# Patient Record
Sex: Male | Born: 1945 | Race: White | Hispanic: No | Marital: Married | Smoking: Current every day smoker
Health system: Southern US, Community
[De-identification: ages and names within clinical notes are randomized; demographics above are authoritative.]

## PROBLEM LIST (undated history)

## (undated) DIAGNOSIS — L409 Psoriasis, unspecified: Secondary | ICD-10-CM

## (undated) DIAGNOSIS — H409 Unspecified glaucoma: Secondary | ICD-10-CM

## (undated) HISTORY — PX: BACK SURGERY: SHX140

---

## 2014-08-09 DIAGNOSIS — H4011X3 Primary open-angle glaucoma, severe stage: Secondary | ICD-10-CM | POA: Diagnosis not present

## 2014-11-01 DIAGNOSIS — H019 Unspecified inflammation of eyelid: Secondary | ICD-10-CM | POA: Diagnosis not present

## 2015-02-01 DIAGNOSIS — H4011X3 Primary open-angle glaucoma, severe stage: Secondary | ICD-10-CM | POA: Diagnosis not present

## 2015-04-24 DIAGNOSIS — Z720 Tobacco use: Secondary | ICD-10-CM | POA: Diagnosis not present

## 2015-04-24 DIAGNOSIS — H409 Unspecified glaucoma: Secondary | ICD-10-CM | POA: Diagnosis not present

## 2015-04-24 DIAGNOSIS — C4490 Unspecified malignant neoplasm of skin, unspecified: Secondary | ICD-10-CM | POA: Diagnosis not present

## 2015-04-24 DIAGNOSIS — M1990 Unspecified osteoarthritis, unspecified site: Secondary | ICD-10-CM | POA: Diagnosis not present

## 2015-05-08 DIAGNOSIS — H401133 Primary open-angle glaucoma, bilateral, severe stage: Secondary | ICD-10-CM | POA: Diagnosis not present

## 2015-08-07 DIAGNOSIS — H401133 Primary open-angle glaucoma, bilateral, severe stage: Secondary | ICD-10-CM | POA: Diagnosis not present

## 2016-02-05 DIAGNOSIS — H524 Presbyopia: Secondary | ICD-10-CM | POA: Diagnosis not present

## 2016-05-06 DIAGNOSIS — H401133 Primary open-angle glaucoma, bilateral, severe stage: Secondary | ICD-10-CM | POA: Diagnosis not present

## 2016-09-29 DIAGNOSIS — H401133 Primary open-angle glaucoma, bilateral, severe stage: Secondary | ICD-10-CM | POA: Diagnosis not present

## 2017-02-04 DIAGNOSIS — H524 Presbyopia: Secondary | ICD-10-CM | POA: Diagnosis not present

## 2017-02-17 DIAGNOSIS — H26493 Other secondary cataract, bilateral: Secondary | ICD-10-CM | POA: Diagnosis not present

## 2017-02-17 DIAGNOSIS — H401133 Primary open-angle glaucoma, bilateral, severe stage: Secondary | ICD-10-CM | POA: Diagnosis not present

## 2017-02-17 DIAGNOSIS — H02839 Dermatochalasis of unspecified eye, unspecified eyelid: Secondary | ICD-10-CM | POA: Diagnosis not present

## 2017-02-17 DIAGNOSIS — Z961 Presence of intraocular lens: Secondary | ICD-10-CM | POA: Diagnosis not present

## 2017-02-17 DIAGNOSIS — H26491 Other secondary cataract, right eye: Secondary | ICD-10-CM | POA: Diagnosis not present

## 2017-03-03 DIAGNOSIS — H26492 Other secondary cataract, left eye: Secondary | ICD-10-CM | POA: Diagnosis not present

## 2017-06-10 DIAGNOSIS — H401133 Primary open-angle glaucoma, bilateral, severe stage: Secondary | ICD-10-CM | POA: Diagnosis not present

## 2017-10-13 DIAGNOSIS — H401133 Primary open-angle glaucoma, bilateral, severe stage: Secondary | ICD-10-CM | POA: Diagnosis not present

## 2018-02-11 DIAGNOSIS — H401133 Primary open-angle glaucoma, bilateral, severe stage: Secondary | ICD-10-CM | POA: Diagnosis not present

## 2018-06-10 DIAGNOSIS — H401133 Primary open-angle glaucoma, bilateral, severe stage: Secondary | ICD-10-CM | POA: Diagnosis not present

## 2018-07-21 DIAGNOSIS — M25519 Pain in unspecified shoulder: Secondary | ICD-10-CM | POA: Diagnosis not present

## 2018-10-06 DIAGNOSIS — H401133 Primary open-angle glaucoma, bilateral, severe stage: Secondary | ICD-10-CM | POA: Diagnosis not present

## 2019-02-15 DIAGNOSIS — H401133 Primary open-angle glaucoma, bilateral, severe stage: Secondary | ICD-10-CM | POA: Diagnosis not present

## 2019-04-19 ENCOUNTER — Other Ambulatory Visit: Payer: Self-pay

## 2019-04-19 DIAGNOSIS — Z20822 Contact with and (suspected) exposure to covid-19: Secondary | ICD-10-CM

## 2019-04-21 LAB — NOVEL CORONAVIRUS, NAA: SARS-CoV-2, NAA: DETECTED — AB

## 2019-05-30 DIAGNOSIS — H938X9 Other specified disorders of ear, unspecified ear: Secondary | ICD-10-CM | POA: Diagnosis not present

## 2019-05-30 DIAGNOSIS — K529 Noninfective gastroenteritis and colitis, unspecified: Secondary | ICD-10-CM | POA: Diagnosis not present

## 2019-05-30 DIAGNOSIS — R05 Cough: Secondary | ICD-10-CM | POA: Diagnosis not present

## 2019-06-04 ENCOUNTER — Ambulatory Visit (HOSPITAL_COMMUNITY)
Admission: EM | Admit: 2019-06-04 | Discharge: 2019-06-04 | Disposition: A | Discharge status: Home / Self Care | Payer: Medicare Other | Attending: Emergency Medicine | Admitting: Emergency Medicine

## 2019-06-04 ENCOUNTER — Telehealth (HOSPITAL_COMMUNITY): Payer: Self-pay | Admitting: *Deleted

## 2019-06-04 ENCOUNTER — Other Ambulatory Visit: Payer: Self-pay

## 2019-06-04 ENCOUNTER — Ambulatory Visit (INDEPENDENT_AMBULATORY_CARE_PROVIDER_SITE_OTHER): Payer: Medicare Other

## 2019-06-04 ENCOUNTER — Encounter (HOSPITAL_COMMUNITY): Payer: Self-pay | Admitting: *Deleted

## 2019-06-04 DIAGNOSIS — K5900 Constipation, unspecified: Secondary | ICD-10-CM | POA: Diagnosis not present

## 2019-06-04 DIAGNOSIS — R05 Cough: Secondary | ICD-10-CM | POA: Diagnosis not present

## 2019-06-04 DIAGNOSIS — R110 Nausea: Secondary | ICD-10-CM | POA: Diagnosis not present

## 2019-06-04 HISTORY — DX: Unspecified glaucoma: H40.9

## 2019-06-04 MED ORDER — DOCUSATE SODIUM 100 MG PO CAPS: 100.0000 mg | ORAL_CAPSULE | Freq: Two times a day (BID) | ORAL | 0 refills | Status: DC

## 2019-06-04 MED ORDER — POLYETHYLENE GLYCOL 3350 17 G PO PACK: 17.0000 g | PACK | Freq: Every day | ORAL | 0 refills | Status: AC

## 2019-06-04 MED ORDER — OMEPRAZOLE 20 MG PO CPDR: 20.0000 mg | DELAYED_RELEASE_CAPSULE | Freq: Every day | ORAL | 0 refills | Status: AC

## 2019-06-04 MED ORDER — DOCUSATE SODIUM 100 MG PO CAPS: 100.0000 mg | ORAL_CAPSULE | Freq: Two times a day (BID) | ORAL | 0 refills | Status: AC

## 2019-06-04 MED ORDER — POLYETHYLENE GLYCOL 3350 17 G PO PACK: 17.0000 g | PACK | Freq: Every day | ORAL | 0 refills | Status: DC

## 2019-06-04 MED ORDER — OMEPRAZOLE 20 MG PO CPDR: 20.0000 mg | DELAYED_RELEASE_CAPSULE | Freq: Every day | ORAL | 0 refills | Status: DC

## 2019-06-04 NOTE — ED Triage Notes (Addendum)
Pt was + Covid 04/19/19 without any sxs.  C/O onset 3 wks ago with cough.  Denies SOB, fever, chills, chest pain. C/O having diarrhea approx 3 wks ago - took Imodium.  States since then has not had a good BM (states very small results) x 3 wks despite taking prune juice and Milk of Mag without results.  Denies nausea, but states anytime he tries to eat solid foods, he vomits.  Denies any abd pain.  States had video visit with PCP office 06/01/19 - given Rx for ondansetron and promethazine w/ codeine syrup.

## 2019-06-04 NOTE — Discharge Instructions (Signed)
Begin omprazole daily for 2 weeks  Please use Miralax for moderate to severe constipation. Take this once a day for the next 2-3 days. Please also start docusate stool softener, twice a day for at least 1 week. If stools become loose, cut down to once a day for another week. If stools remain loose, cut back to 1 pill every other day for a third week. You can stop docusate thereafter and resume as needed for constipation.  To help reduce constipation and promote bowel health: 1. Drink at least 64 ounces of water each day 2. Eat plenty of fiber (fruits, vegetables, whole grains, legumes) 3. Be physically active or exercise including walking, jogging, swimming, yoga, etc. 4. For active constipation use a stool softener (docusate) or an osmotic laxative (like Miralax) each day, or as needed.

## 2019-06-05 NOTE — ED Provider Notes (Signed)
[facility]    CSN: [id number] Arrival date & time: 06/04/19  1040      History   Chief Complaint Chief Complaint  Patient presents with  . Cough  . Constipation  . Emesis    HPI Jose Glenn is a 74 y.o. male history of tobacco use, glaucoma, presenting today for evaluation of cough and constipation.  Patient states that he tested positive for Covid on 11/25.  At the time he did not have any symptoms.  After the past 3 weeks he does note that he has had a cough.  Denies associated sore throat, congestion, fevers chills or body aches.  He also notes that initially he had a small amount of loose stools and took Imodium.  Over the past 3 weeks since this he has had very small bowel movements as well as associated nausea with eating.  He notes that he is able to tolerate liquids and soft foods, but any solid foods make him very nauseous.  He denies associated abdominal pain.  He was given Zofran and promethazine with codeine which have not helped his symptoms.  Denies any blood in the stool.  Has history of prior hernia repair.  Reports greater than 50-pack-year history.  HPI  Past Medical History:  Diagnosis Date  . Glaucoma     There are no problems to display for this patient.   Past Surgical History:  Procedure Laterality Date  . BACK SURGERY         Home Medications    Prior to Admission medications   Medication Sig Start Date End Date Taking? Authorizing Provider  Brinzolamide (AZOPT OP) Apply to eye.   Yes [provider]  ondansetron (ZOFRAN) 8 MG tablet Take by mouth every 8 (eight) hours as needed for nausea or vomiting.   Yes [provider]  promethazine-codeine (PHENERGAN WITH CODEINE) 6.25-10 MG/5ML syrup Take by mouth every 6 (six) hours as needed for cough.   Yes [provider]  TIMOLOL-HYDROCHLOROTHIAZIDE PO Take by mouth.   Yes [provider]  UNKNOWN TO PATIENT Another glaucoma eye gtt   Yes [provider]  docusate sodium (COLACE) 100 MG capsule Take 1 capsule (100 mg total) by mouth 2 (two) times daily for 7 days. 06/04/19 06/11/19  Melynda Ripple, MD  omeprazole (PRILOSEC) 20 MG capsule Take 1 capsule (20 mg total) by mouth daily for 14 days. 06/04/19 06/18/19  Melynda Ripple, MD  polyethylene glycol (MIRALAX / GLYCOLAX) 17 g packet Take 17 g by mouth daily for 7 days. 06/04/19 06/11/19  Melynda Ripple, MD    Family History Family History  Family history unknown: Yes    Social History Social History   Tobacco Use  . Smoking status: Current Every Day Smoker  . Smokeless tobacco: Never Used  Substance Use Topics  . Alcohol use: Not Currently  . Drug use: Never     Allergies   Patient has no known allergies.   Review of Systems Review of Systems  Constitutional: Negative for activity change, appetite change, chills, fatigue and fever.  HENT: Negative for congestion, ear pain, rhinorrhea, sinus pressure, sore throat and trouble swallowing.   Eyes: Negative for discharge and redness.  Respiratory: Positive for cough. Negative for chest tightness and shortness of breath.   Cardiovascular: Negative for chest pain.  Gastrointestinal: Positive for constipation. Negative for abdominal pain, diarrhea, nausea and vomiting.  Musculoskeletal: Negative for myalgias.  Skin: Negative for rash.  Neurological: Negative for dizziness,  light-headedness and headaches.     Physical Exam Triage Vital Signs ED Triage Vitals  Enc Vitals Group     BP 06/04/19 1152 130/86     Pulse Rate 06/04/19 1152 87     Resp 06/04/19 1152 18     Temp 06/04/19 1152 (!) 97.2 F (36.2 C)     Temp Source 06/04/19 1152 Oral     SpO2 06/04/19 1152 96 %     Weight --      Height --      Head Circumference --      Peak Flow --      Pain Score 06/04/19 1153 0     Pain Loc --      Pain Edu? --      Excl. in [place]? --    No data found.  Updated Vital Signs BP 130/86   Pulse 87   Temp (!)  97.2 F (36.2 C) (Oral)   Resp 18   SpO2 96%   Visual Acuity Right Eye Distance:   Left Eye Distance:   Bilateral Distance:    Right Eye Near:   Left Eye Near:    Bilateral Near:     Physical Exam Vitals and nursing note reviewed.  Constitutional:      Appearance: He is well-developed.     Comments: Sitting comfortably on exam table, no acute distress  HENT:     Head: Normocephalic and atraumatic.     Ears:     Comments: Bilateral ears without tenderness to palpation of external auricle, tragus and mastoid, EAC's without erythema or swelling, TM's with good bony landmarks and cone of light. Non erythematous.    Mouth/Throat:     Comments: Oral mucosa pink and moist, no tonsillar enlargement or exudate. Posterior pharynx patent and nonerythematous, no uvula deviation or swelling. Normal phonation. Eyes:     Conjunctiva/sclera: Conjunctivae normal.  Cardiovascular:     Rate and Rhythm: Normal rate and regular rhythm.     Heart sounds: No murmur.  Pulmonary:     Effort: Pulmonary effort is normal. No respiratory distress.     Breath sounds: Normal breath sounds.     Comments: Breathing comfortably at rest, CTABL, no wheezing, rales or other adventitious sounds auscultated Abdominal:     Palpations: Abdomen is soft.     Tenderness: There is no abdominal tenderness.     Comments: Soft, nondistended, nontender to light and deep palpation throughout entire abdomen  Musculoskeletal:     Cervical back: Neck supple.  Skin:    General: Skin is warm and dry.  Neurological:     Mental Status: He is alert.      UC Treatments / Results  Labs (all labs ordered are listed, but only abnormal results are displayed) Labs Reviewed - No data to display  EKG   Radiology DG Abd Acute W/Chest  Result Date: 06/04/2019 CLINICAL DATA:  Cough and constipation, smoker EXAM: DG ABDOMEN ACUTE W/ 1V CHEST COMPARISON:  None. FINDINGS: Normal heart size and vascularity. Lungs remain clear. No  focal pneumonia, collapse or consolidation. Negative for edema, effusion pneumothorax. Trachea midline. Aorta atherosclerotic. No free air. Scattered air and stool throughout the bowel. Negative for obstruction or significant dilatation. No ileus. Degenerative changes of the spine. No acute osseous finding. No calcifications. IMPRESSION: No acute finding by plain radiography. Electronically Signed   By: Jerilynn Mages.  Shick M.D.   On: 06/04/2019 12:50    Procedures Procedures (including critical care time)  Medications  Ordered in UC Medications - No data to display  Initial Impression / Assessment and Plan / UC Course  I have reviewed the triage vital signs and the nursing notes.  Pertinent labs & imaging results that were available during my care of the patient were reviewed by me and considered in my medical decision making (see chart for details).     Acute abdomen was just obtained given length of cough as well as persistence of constipation with associated nausea.  No sign of obstruction from her pneumonia.  Does show scattered stool throughout colon.  Will treat for constipation with MiraLAX and Colace.  Also discussed lifestyle modifications.  Given length of nausea, do not suspect this is related to infectious etiology.  Possible underlying GERD/gastritis.  Will initiate on omeprazole daily for 2-week trial.  If symptoms persist consider gastroenterology referral.  Patient has never had a colonoscopy.  Discussed strict return precautions. Patient verbalized understanding and is agreeable with plan.  Final Clinical Impressions(s) / UC Diagnoses   Final diagnoses:  Constipation, unspecified constipation type  Nausea without vomiting     Discharge Instructions     Begin omprazole daily for 2 weeks  Please use Miralax for moderate to severe constipation. Take this once a day for the next 2-3 days. Please also start docusate stool softener, twice a day for at least 1 week. If stools become  loose, cut down to once a day for another week. If stools remain loose, cut back to 1 pill every other day for a third week. You can stop docusate thereafter and resume as needed for constipation.  To help reduce constipation and promote bowel health: 1. Drink at least 64 ounces of water each day 2. Eat plenty of fiber (fruits, vegetables, whole grains, legumes) 3. Be physically active or exercise including walking, jogging, swimming, yoga, etc. 4. For active constipation use a stool softener (docusate) or an osmotic laxative (like Miralax) each day, or as needed.   ED Prescriptions    Medication Sig Dispense Auth. Provider   omeprazole (PRILOSEC) 20 MG capsule Take 1 capsule (20 mg total) by mouth daily for 14 days. 14 capsule Amit Meloy C, PA-C   polyethylene glycol (MIRALAX / GLYCOLAX) 17 g packet Take 17 g by mouth daily for 7 days. 14 each Leniyah Martell C, PA-C   docusate sodium (COLACE) 100 MG capsule Take 1 capsule (100 mg total) by mouth 2 (two) times daily for 7 days. 14 capsule Cledith Kamiya, [place] C, PA-C     PDMP not reviewed this encounter.   Joneen Caraway Mood[place] C, [place] 06/05/19 [phone]

## 2019-07-07 DIAGNOSIS — H401133 Primary open-angle glaucoma, bilateral, severe stage: Secondary | ICD-10-CM | POA: Diagnosis not present

## 2019-07-18 DIAGNOSIS — R05 Cough: Secondary | ICD-10-CM | POA: Diagnosis not present

## 2019-07-18 DIAGNOSIS — H6120 Impacted cerumen, unspecified ear: Secondary | ICD-10-CM | POA: Diagnosis not present

## 2019-07-18 DIAGNOSIS — D489 Neoplasm of uncertain behavior, unspecified: Secondary | ICD-10-CM | POA: Diagnosis not present

## 2019-10-17 ENCOUNTER — Other Ambulatory Visit: Payer: Self-pay

## 2019-10-17 ENCOUNTER — Ambulatory Visit (HOSPITAL_COMMUNITY)
Admission: EM | Admit: 2019-10-17 | Discharge: 2019-10-17 | Disposition: A | Discharge status: Home / Self Care | Payer: Medicare Other

## 2019-10-17 ENCOUNTER — Encounter (HOSPITAL_COMMUNITY): Payer: Self-pay | Admitting: Emergency Medicine

## 2019-10-17 DIAGNOSIS — K5900 Constipation, unspecified: Secondary | ICD-10-CM | POA: Diagnosis not present

## 2019-10-17 HISTORY — DX: Psoriasis, unspecified: L40.9

## 2019-10-17 NOTE — Discharge Instructions (Addendum)
Increase the MiraLAX to daily until you get a good bowel movement. Make sure you increase your fiber and eat small meals. Increase your water intake and stay hydrated If your symptoms worsen to include worsening pain or vomiting you need to go to the ER.

## 2019-10-17 NOTE — ED Provider Notes (Signed)
[facility]    CSN: [id number] Arrival date & time: 10/17/19  0803      History   Chief Complaint Chief Complaint  Patient presents with  . Abdominal Pain    HPI Jose Glenn is a 74 y.o. male.   Pt is a 74 year old male with a history of constipation, hernia repair, gastritis, and back surgery. Pt reports inability to tolerate solid food since Friday. Reports adequate intake of PO liquids. Pt complains of gagging/coughing after attempting to eat solid foods related to a feeling of fullness. Complains of lower abdominal pain, states likely related to sore muscles from coughing. Pain relieved by laying on side, worsened by coughing. Unsure of last bowel movement, states several small stools. No relief of symptoms after taking Miralax 3 days ago. States symptoms similar to constipation in the past. Reports low intake of fiber. Denies chest pain, nausea, vomiting, back pain, urinary frequency/urgency, dysuria, or being around anyone with similar symptoms or illness.   ROS per HPI      Past Medical History:  Diagnosis Date  . Glaucoma   . Psoriasis     There are no problems to display for this patient.   Past Surgical History:  Procedure Laterality Date  . BACK SURGERY         Home Medications    Prior to Admission medications   Medication Sig Start Date End Date Taking? Authorizing Provider  Brinzolamide (AZOPT OP) Apply to eye.   Yes [provider]  ondansetron (ZOFRAN) 8 MG tablet Take by mouth every 8 (eight) hours as needed for nausea or vomiting.   Yes [provider]  promethazine-codeine (PHENERGAN WITH CODEINE) 6.25-10 MG/5ML syrup Take by mouth every 6 (six) hours as needed for cough.   Yes [provider]  TIMOLOL-HYDROCHLOROTHIAZIDE PO Take by mouth.   Yes [provider]  UNKNOWN TO PATIENT Another glaucoma eye gtt   Yes [provider]  omeprazole (PRILOSEC) 20 MG capsule Take 1 capsule (20 mg  total) by mouth daily for 14 days. 06/04/19 06/18/19  Melynda Ripple, MD    Family History Family History  Family history unknown: Yes    Social History Social History   Tobacco Use  . Smoking status: Current Every Day Smoker    Packs/day: 1.00  . Smokeless tobacco: Never Used  Substance Use Topics  . Alcohol use: Not Currently  . Drug use: Never     Allergies   Patient has no known allergies.   Review of Systems Review of Systems   Physical Exam Triage Vital Signs ED Triage Vitals  Enc Vitals Group     BP 10/17/19 0830 129/76     Pulse Rate 10/17/19 0830 89     Resp 10/17/19 0830 18     Temp 10/17/19 0830 97.8 F (36.6 C)     Temp Source 10/17/19 0830 Oral     SpO2 10/17/19 0830 100 %     Weight --      Height --      Head Circumference --      Peak Flow --      Pain Score 10/17/19 0833 9     Pain Loc --      Pain Edu? --      Excl. in [place]? --    No data found.  Updated Vital Signs BP 129/76 (BP Location: Right Arm)   Pulse 89   Temp 97.8 F (36.6 C) (Oral)   Resp 18  SpO2 100%   Visual Acuity Right Eye Distance:   Left Eye Distance:   Bilateral Distance:    Right Eye Near:   Left Eye Near:    Bilateral Near:     Physical Exam Vitals and nursing note reviewed.  Constitutional:      General: He is not in acute distress.    Appearance: Normal appearance. He is not ill-appearing, toxic-appearing or diaphoretic.  HENT:     Head: Normocephalic and atraumatic.     Nose: Nose normal.  Eyes:     Conjunctiva/sclera: Conjunctivae normal.  Cardiovascular:     Rate and Rhythm: Normal rate and regular rhythm.  Pulmonary:     Effort: Pulmonary effort is normal.     Breath sounds: Normal breath sounds.  Abdominal:     General: Bowel sounds are normal.     Palpations: Abdomen is soft.     Tenderness: There is generalized abdominal tenderness and tenderness in the left lower quadrant. There is no right CVA tenderness, left CVA tenderness, guarding  or rebound.  Musculoskeletal:        General: Normal range of motion.     Cervical back: Normal range of motion.  Skin:    General: Skin is warm and dry.  Neurological:     Mental Status: He is alert.  Psychiatric:        Mood and Affect: Mood normal.      UC Treatments / Results  Labs (all labs ordered are listed, but only abnormal results are displayed) Labs Reviewed - No data to display  EKG   Radiology No results found.  Procedures Procedures (including critical care time)  Medications Ordered in UC Medications - No data to display  Initial Impression / Assessment and Plan / UC Course  I have reviewed the triage vital signs and the nursing notes.  Pertinent labs & imaging results that were available during my care of the patient were reviewed by me and considered in my medical decision making (see chart for details).      Constipation Patient with chronic constipation. Symptoms and exam most consistent with this today. Recommended abdominal x-ray to rule out bowel obstruction but patient refused.  Not terribly concerned for this at this time.  Recommended if symptoms continue or worsen he will need to go to the ER. Recommended increase MiraLAX to daily until good bowel movement. Also recommend increase water and fiber Follow up as needed for continued or worsening symptoms  Final Clinical Impressions(s) / UC Diagnoses   Final diagnoses:  Constipation, unspecified constipation type     Discharge Instructions     Increase the MiraLAX to daily until you get a good bowel movement. Make sure you increase your fiber and eat small meals. Increase your water intake and stay hydrated If your symptoms worsen to include worsening pain or vomiting you need to go to the ER.     ED Prescriptions    None     PDMP not reviewed this encounter.   Orvan July, NP 10/17/19 [phone]

## 2019-10-17 NOTE — ED Triage Notes (Signed)
Pt c/o abd pain onset Friday, has had diarrhea with each meal. Has not been able to tolerate solid foods since Friday. Denies fever or blood in stool. Has been taking Maalox with no relief.

## 2019-10-17 NOTE — ED Notes (Signed)
Per Ruben Im, CMA, pt declined COVID testing on intake eval.

## 2019-10-21 DIAGNOSIS — R109 Unspecified abdominal pain: Secondary | ICD-10-CM | POA: Diagnosis not present

## 2019-10-27 DIAGNOSIS — H401133 Primary open-angle glaucoma, bilateral, severe stage: Secondary | ICD-10-CM | POA: Diagnosis not present

## 2020-01-11 ENCOUNTER — Encounter: Payer: Self-pay | Admitting: Internal Medicine

## 2020-01-11 LAB — IFOBT (OCCULT BLOOD): IFOBT: NEGATIVE

## 2020-02-16 DIAGNOSIS — H401133 Primary open-angle glaucoma, bilateral, severe stage: Secondary | ICD-10-CM | POA: Diagnosis not present

## 2020-02-17 DIAGNOSIS — H353134 Nonexudative age-related macular degeneration, bilateral, advanced atrophic with subfoveal involvement: Secondary | ICD-10-CM | POA: Diagnosis not present

## 2020-04-13 DIAGNOSIS — H02142 Spastic ectropion of right lower eyelid: Secondary | ICD-10-CM | POA: Diagnosis not present

## 2020-04-13 DIAGNOSIS — H16213 Exposure keratoconjunctivitis, bilateral: Secondary | ICD-10-CM | POA: Diagnosis not present

## 2020-04-13 DIAGNOSIS — H02145 Spastic ectropion of left lower eyelid: Secondary | ICD-10-CM | POA: Diagnosis not present

## 2020-06-19 DIAGNOSIS — H401133 Primary open-angle glaucoma, bilateral, severe stage: Secondary | ICD-10-CM | POA: Diagnosis not present

## 2020-07-08 IMAGING — DX DG ABDOMEN ACUTE W/ 1V CHEST
4 series · 4 of 4 positions shown · non-contrast
Comparison: None.

CLINICAL DATA: Cough and constipation, smoker

EXAM:
DG ABDOMEN ACUTE W/ 1V CHEST

[chest pa]
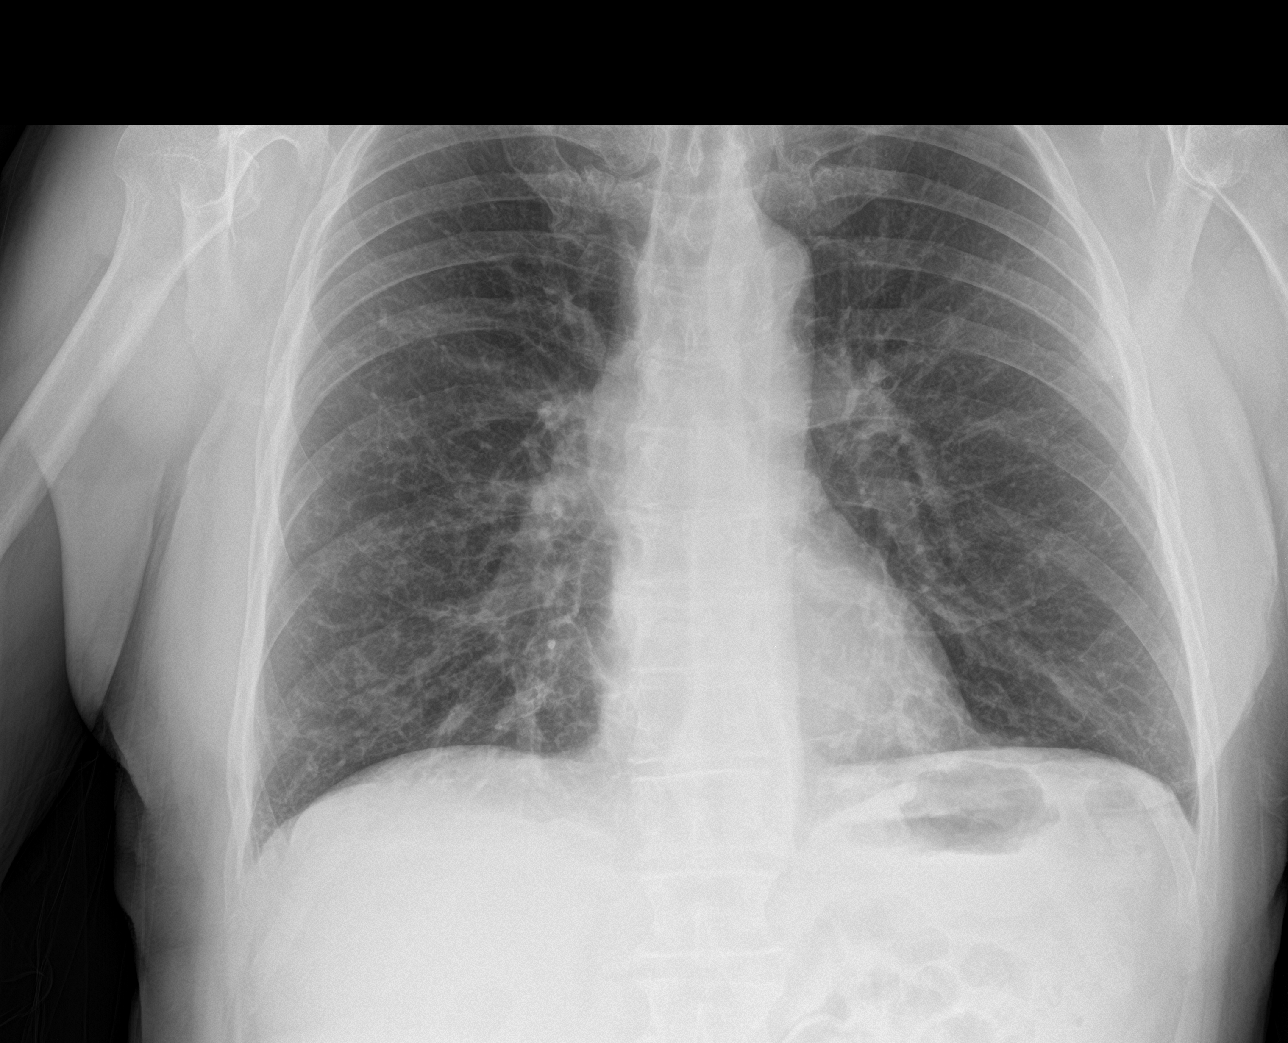

[abdomen erect]
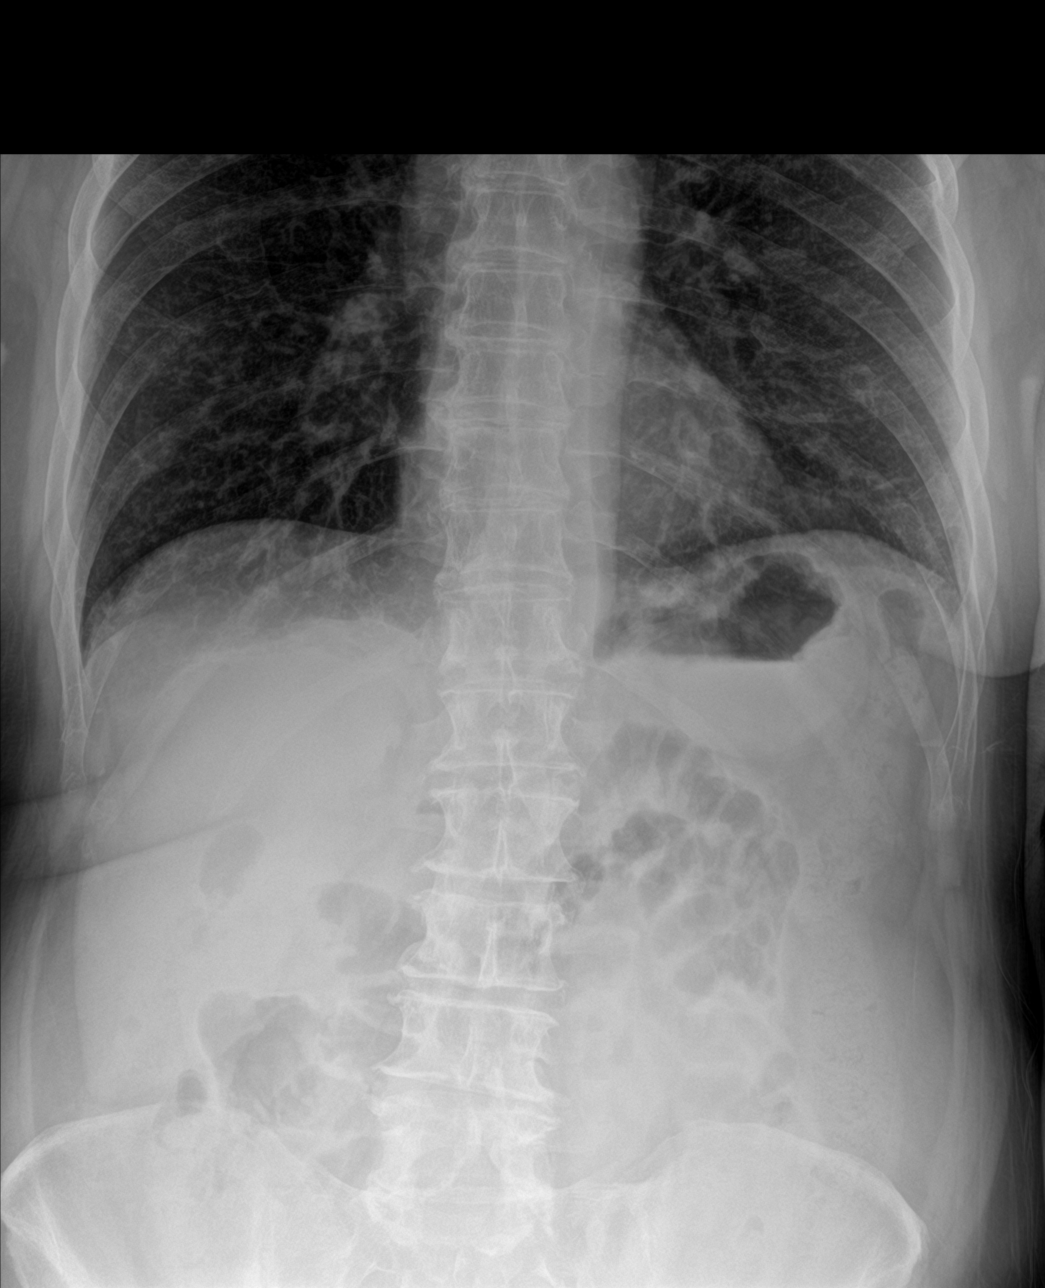

[abdomen supine (1 of 2)]
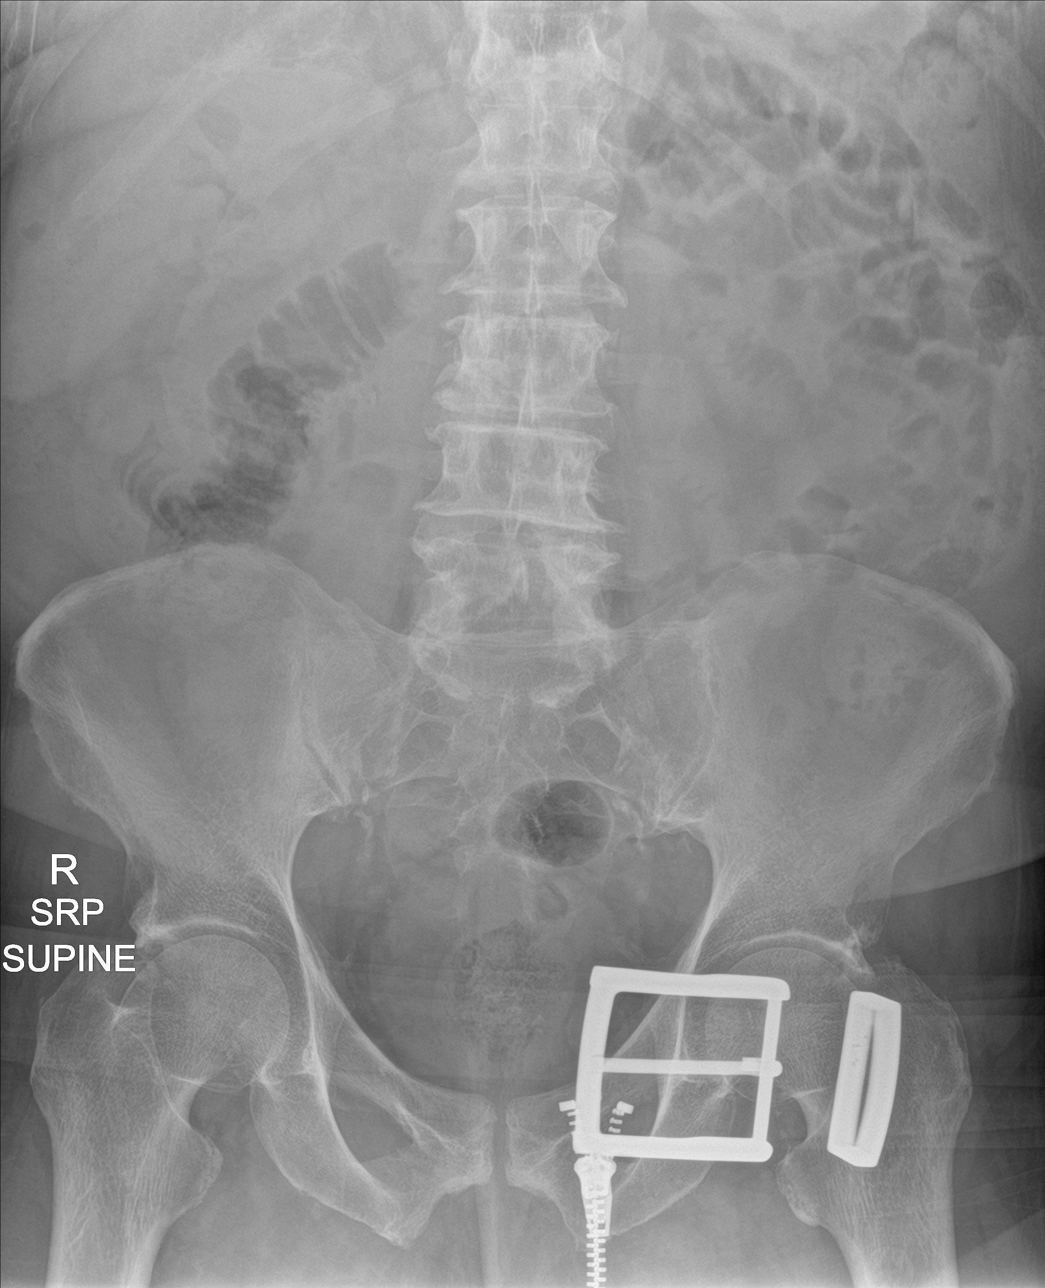

[abdomen supine (2 of 2)]
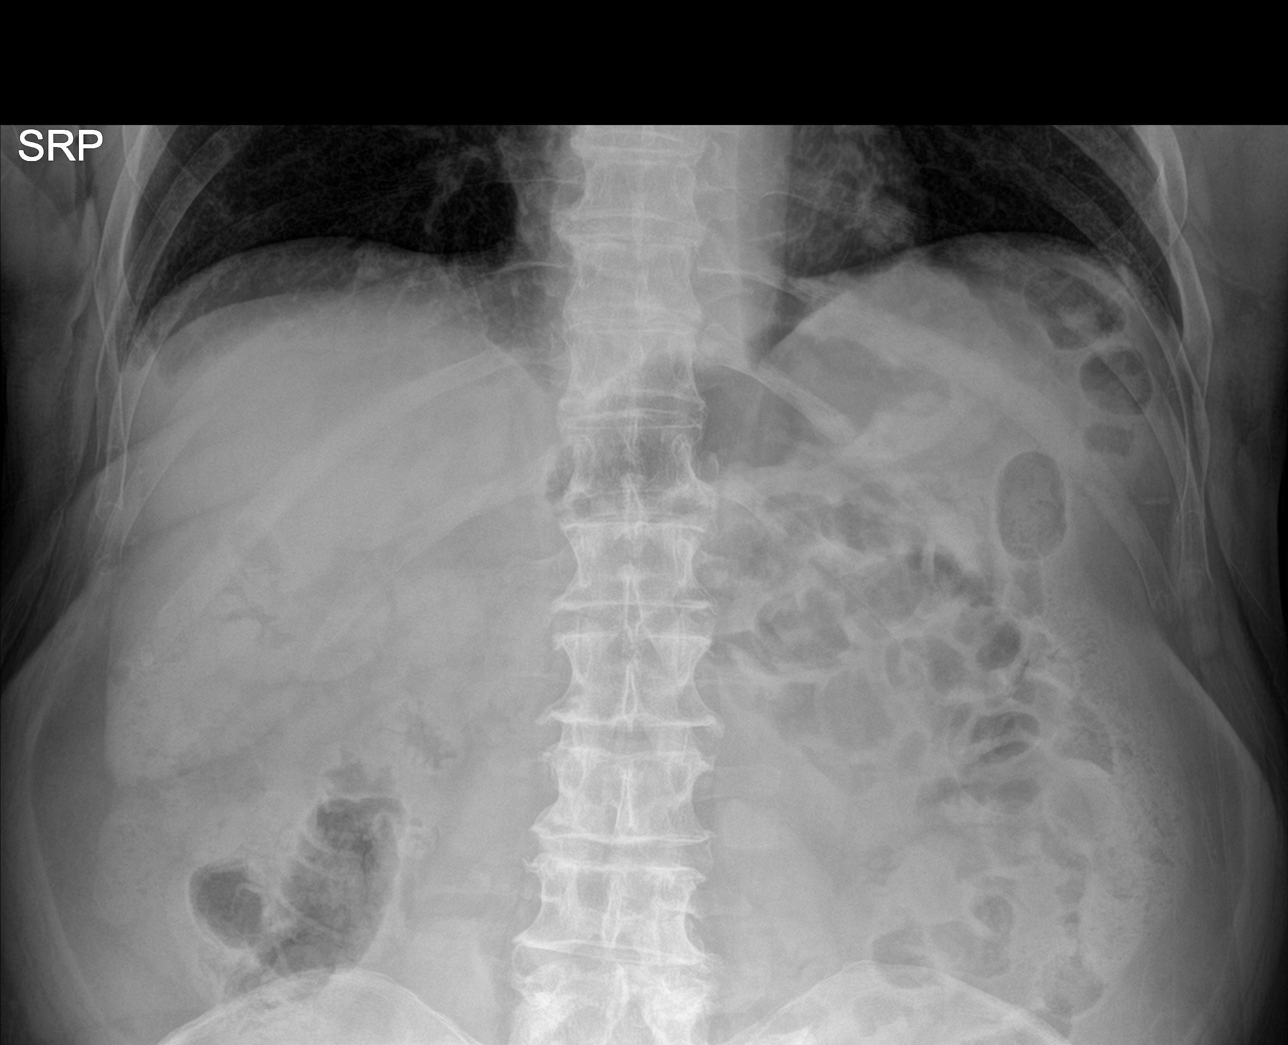

[4 of 4 positions shown; findings below may reference images not displayed]

FINDINGS: Normal heart size and vascularity. Lungs remain clear. No focal
pneumonia, collapse or consolidation. Negative for edema, effusion
pneumothorax. Trachea midline. Aorta atherosclerotic. No free air.
Scattered air and stool throughout the bowel. Negative for
obstruction or significant dilatation. No ileus. Degenerative
changes of the spine. No acute osseous finding. No calcifications.
IMPRESSION: No acute finding by plain radiography.

## 2020-10-10 DIAGNOSIS — H401133 Primary open-angle glaucoma, bilateral, severe stage: Secondary | ICD-10-CM | POA: Diagnosis not present

## 2021-02-19 DIAGNOSIS — H401133 Primary open-angle glaucoma, bilateral, severe stage: Secondary | ICD-10-CM | POA: Diagnosis not present

## 2021-03-19 DIAGNOSIS — L400 Psoriasis vulgaris: Secondary | ICD-10-CM | POA: Diagnosis not present

## 2021-03-19 DIAGNOSIS — D485 Neoplasm of uncertain behavior of skin: Secondary | ICD-10-CM | POA: Diagnosis not present

## 2021-04-09 DIAGNOSIS — L400 Psoriasis vulgaris: Secondary | ICD-10-CM | POA: Diagnosis not present

## 2021-04-09 DIAGNOSIS — C44311 Basal cell carcinoma of skin of nose: Secondary | ICD-10-CM | POA: Diagnosis not present

## 2021-07-03 DIAGNOSIS — H401133 Primary open-angle glaucoma, bilateral, severe stage: Secondary | ICD-10-CM | POA: Diagnosis not present

## 2021-08-28 DIAGNOSIS — C44311 Basal cell carcinoma of skin of nose: Secondary | ICD-10-CM | POA: Diagnosis not present

## 2021-08-29 DIAGNOSIS — C44311 Basal cell carcinoma of skin of nose: Secondary | ICD-10-CM | POA: Diagnosis not present

## 2021-08-30 DIAGNOSIS — Z85828 Personal history of other malignant neoplasm of skin: Secondary | ICD-10-CM | POA: Diagnosis not present

## 2021-08-30 DIAGNOSIS — M950 Acquired deformity of nose: Secondary | ICD-10-CM | POA: Diagnosis not present

## 2021-08-30 DIAGNOSIS — Z428 Encounter for other plastic and reconstructive surgery following medical procedure or healed injury: Secondary | ICD-10-CM | POA: Diagnosis not present

## 2021-08-30 DIAGNOSIS — F17210 Nicotine dependence, cigarettes, uncomplicated: Secondary | ICD-10-CM | POA: Diagnosis not present

## 2021-08-30 DIAGNOSIS — C44319 Basal cell carcinoma of skin of other parts of face: Secondary | ICD-10-CM | POA: Diagnosis not present

## 2021-08-30 DIAGNOSIS — C44311 Basal cell carcinoma of skin of nose: Secondary | ICD-10-CM | POA: Diagnosis not present

## 2021-09-13 DIAGNOSIS — Z85828 Personal history of other malignant neoplasm of skin: Secondary | ICD-10-CM | POA: Diagnosis not present

## 2021-09-13 DIAGNOSIS — C44311 Basal cell carcinoma of skin of nose: Secondary | ICD-10-CM | POA: Diagnosis not present

## 2021-09-13 DIAGNOSIS — M950 Acquired deformity of nose: Secondary | ICD-10-CM | POA: Diagnosis not present

## 2021-10-04 DIAGNOSIS — C44311 Basal cell carcinoma of skin of nose: Secondary | ICD-10-CM | POA: Diagnosis not present

## 2021-10-04 DIAGNOSIS — Z483 Aftercare following surgery for neoplasm: Secondary | ICD-10-CM | POA: Diagnosis not present

## 2021-10-04 DIAGNOSIS — F17210 Nicotine dependence, cigarettes, uncomplicated: Secondary | ICD-10-CM | POA: Diagnosis not present

## 2021-10-09 DIAGNOSIS — C44311 Basal cell carcinoma of skin of nose: Secondary | ICD-10-CM | POA: Diagnosis not present

## 2021-10-25 DIAGNOSIS — C44311 Basal cell carcinoma of skin of nose: Secondary | ICD-10-CM | POA: Diagnosis not present

## 2021-10-25 DIAGNOSIS — Z9889 Other specified postprocedural states: Secondary | ICD-10-CM | POA: Diagnosis not present

## 2021-11-12 DIAGNOSIS — H401133 Primary open-angle glaucoma, bilateral, severe stage: Secondary | ICD-10-CM | POA: Diagnosis not present

## 2022-05-22 DIAGNOSIS — H35363 Drusen (degenerative) of macula, bilateral: Secondary | ICD-10-CM | POA: Diagnosis not present

## 2022-06-05 DIAGNOSIS — C44319 Basal cell carcinoma of skin of other parts of face: Secondary | ICD-10-CM | POA: Diagnosis not present

## 2022-06-05 DIAGNOSIS — L570 Actinic keratosis: Secondary | ICD-10-CM | POA: Diagnosis not present

## 2022-06-05 DIAGNOSIS — Z85828 Personal history of other malignant neoplasm of skin: Secondary | ICD-10-CM | POA: Diagnosis not present

## 2022-06-05 DIAGNOSIS — C44519 Basal cell carcinoma of skin of other part of trunk: Secondary | ICD-10-CM | POA: Diagnosis not present

## 2022-06-05 DIAGNOSIS — L400 Psoriasis vulgaris: Secondary | ICD-10-CM | POA: Diagnosis not present

## 2022-06-25 DIAGNOSIS — H401132 Primary open-angle glaucoma, bilateral, moderate stage: Secondary | ICD-10-CM | POA: Diagnosis not present

## 2022-06-25 DIAGNOSIS — H353110 Nonexudative age-related macular degeneration, right eye, stage unspecified: Secondary | ICD-10-CM | POA: Diagnosis not present

## 2022-06-25 DIAGNOSIS — H353123 Nonexudative age-related macular degeneration, left eye, advanced atrophic without subfoveal involvement: Secondary | ICD-10-CM | POA: Diagnosis not present

## 2022-06-25 DIAGNOSIS — H353114 Nonexudative age-related macular degeneration, right eye, advanced atrophic with subfoveal involvement: Secondary | ICD-10-CM | POA: Diagnosis not present

## 2022-07-25 DIAGNOSIS — Z0000 Encounter for general adult medical examination without abnormal findings: Secondary | ICD-10-CM | POA: Diagnosis not present

## 2022-07-25 DIAGNOSIS — L409 Psoriasis, unspecified: Secondary | ICD-10-CM | POA: Diagnosis not present

## 2022-07-25 DIAGNOSIS — H409 Unspecified glaucoma: Secondary | ICD-10-CM | POA: Diagnosis not present

## 2022-08-20 DIAGNOSIS — H401132 Primary open-angle glaucoma, bilateral, moderate stage: Secondary | ICD-10-CM | POA: Diagnosis not present

## 2022-08-20 DIAGNOSIS — H353134 Nonexudative age-related macular degeneration, bilateral, advanced atrophic with subfoveal involvement: Secondary | ICD-10-CM | POA: Diagnosis not present

## 2022-09-01 DIAGNOSIS — D1801 Hemangioma of skin and subcutaneous tissue: Secondary | ICD-10-CM | POA: Diagnosis not present

## 2022-09-01 DIAGNOSIS — C44212 Basal cell carcinoma of skin of right ear and external auricular canal: Secondary | ICD-10-CM | POA: Diagnosis not present

## 2022-09-01 DIAGNOSIS — Z85828 Personal history of other malignant neoplasm of skin: Secondary | ICD-10-CM | POA: Diagnosis not present

## 2022-09-01 DIAGNOSIS — D225 Melanocytic nevi of trunk: Secondary | ICD-10-CM | POA: Diagnosis not present

## 2022-09-01 DIAGNOSIS — D485 Neoplasm of uncertain behavior of skin: Secondary | ICD-10-CM | POA: Diagnosis not present

## 2022-09-01 DIAGNOSIS — L570 Actinic keratosis: Secondary | ICD-10-CM | POA: Diagnosis not present

## 2022-09-01 DIAGNOSIS — L400 Psoriasis vulgaris: Secondary | ICD-10-CM | POA: Diagnosis not present

## 2022-09-01 DIAGNOSIS — C44319 Basal cell carcinoma of skin of other parts of face: Secondary | ICD-10-CM | POA: Diagnosis not present

## 2022-09-30 DIAGNOSIS — C44319 Basal cell carcinoma of skin of other parts of face: Secondary | ICD-10-CM | POA: Diagnosis not present

## 2022-11-12 DIAGNOSIS — R42 Dizziness and giddiness: Secondary | ICD-10-CM | POA: Diagnosis not present

## 2022-11-20 DIAGNOSIS — H401133 Primary open-angle glaucoma, bilateral, severe stage: Secondary | ICD-10-CM | POA: Diagnosis not present

## 2023-02-23 DIAGNOSIS — M62830 Muscle spasm of back: Secondary | ICD-10-CM | POA: Diagnosis not present

## 2023-03-03 DIAGNOSIS — Z85828 Personal history of other malignant neoplasm of skin: Secondary | ICD-10-CM | POA: Diagnosis not present

## 2023-03-03 DIAGNOSIS — L400 Psoriasis vulgaris: Secondary | ICD-10-CM | POA: Diagnosis not present

## 2023-03-03 DIAGNOSIS — L570 Actinic keratosis: Secondary | ICD-10-CM | POA: Diagnosis not present

## 2023-05-25 DIAGNOSIS — H353131 Nonexudative age-related macular degeneration, bilateral, early dry stage: Secondary | ICD-10-CM | POA: Diagnosis not present

## 2023-09-01 DIAGNOSIS — M1990 Unspecified osteoarthritis, unspecified site: Secondary | ICD-10-CM | POA: Diagnosis not present

## 2023-09-01 DIAGNOSIS — Z131 Encounter for screening for diabetes mellitus: Secondary | ICD-10-CM | POA: Diagnosis not present

## 2023-09-01 DIAGNOSIS — H409 Unspecified glaucoma: Secondary | ICD-10-CM | POA: Diagnosis not present

## 2023-09-01 DIAGNOSIS — Z1211 Encounter for screening for malignant neoplasm of colon: Secondary | ICD-10-CM | POA: Diagnosis not present

## 2023-09-01 DIAGNOSIS — Z720 Tobacco use: Secondary | ICD-10-CM | POA: Diagnosis not present

## 2023-09-01 DIAGNOSIS — Z1159 Encounter for screening for other viral diseases: Secondary | ICD-10-CM | POA: Diagnosis not present

## 2023-09-01 DIAGNOSIS — C4490 Unspecified malignant neoplasm of skin, unspecified: Secondary | ICD-10-CM | POA: Diagnosis not present

## 2023-09-01 DIAGNOSIS — Z0000 Encounter for general adult medical examination without abnormal findings: Secondary | ICD-10-CM | POA: Diagnosis not present

## 2023-10-21 DIAGNOSIS — H353131 Nonexudative age-related macular degeneration, bilateral, early dry stage: Secondary | ICD-10-CM | POA: Diagnosis not present

## 2023-11-17 DIAGNOSIS — H401133 Primary open-angle glaucoma, bilateral, severe stage: Secondary | ICD-10-CM | POA: Diagnosis not present

## 2024-01-26 DIAGNOSIS — Z79899 Other long term (current) drug therapy: Secondary | ICD-10-CM | POA: Diagnosis not present

## 2024-01-26 DIAGNOSIS — L400 Psoriasis vulgaris: Secondary | ICD-10-CM | POA: Diagnosis not present

## 2024-01-26 DIAGNOSIS — Z85828 Personal history of other malignant neoplasm of skin: Secondary | ICD-10-CM | POA: Diagnosis not present
